# Patient Record
Sex: Female | Born: 1965 | Race: White | Hispanic: No | Marital: Married | Smoking: Never smoker
Health system: Southern US, Community
[De-identification: ages and names within clinical notes are randomized; demographics above are authoritative.]

---

## 2009-02-12 ENCOUNTER — Emergency Department (HOSPITAL_COMMUNITY): Admission: EM | Admit: 2009-02-12 | Discharge: 2009-02-12 | Payer: Self-pay | Admitting: Emergency Medicine

## 2011-03-26 LAB — URINE MICROSCOPIC-ADD ON

## 2011-03-26 LAB — URINALYSIS, ROUTINE W REFLEX MICROSCOPIC
Bilirubin Urine: NEGATIVE
Glucose, UA: NEGATIVE mg/dL
Ketones, ur: 40 mg/dL — AB
Nitrite: NEGATIVE
Protein, ur: 30 mg/dL — AB
pH: 8 (ref 5.0–8.0)

## 2011-03-26 LAB — POCT PREGNANCY, URINE: Preg Test, Ur: NEGATIVE

## 2011-03-26 LAB — POCT I-STAT, CHEM 8
Hemoglobin: 14.6 g/dL (ref 13.0–17.0)
Sodium: 140 mEq/L (ref 135–145)
TCO2: 23 mmol/L (ref 0–100)

## 2014-12-27 ENCOUNTER — Encounter (HOSPITAL_COMMUNITY): Payer: Self-pay | Admitting: Emergency Medicine

## 2014-12-27 ENCOUNTER — Emergency Department (HOSPITAL_COMMUNITY)
Admission: EM | Admit: 2014-12-27 | Discharge: 2014-12-27 | Disposition: A | Discharge status: Home / Self Care | Payer: BLUE CROSS/BLUE SHIELD | Attending: Emergency Medicine | Admitting: Emergency Medicine

## 2014-12-27 DIAGNOSIS — M549 Dorsalgia, unspecified: Secondary | ICD-10-CM | POA: Diagnosis present

## 2014-12-27 LAB — URINALYSIS, ROUTINE W REFLEX MICROSCOPIC
Bilirubin Urine: NEGATIVE
GLUCOSE, UA: NEGATIVE mg/dL
HGB URINE DIPSTICK: NEGATIVE
Ketones, ur: 15 mg/dL — AB
LEUKOCYTES UA: NEGATIVE
NITRITE: NEGATIVE
PH: 5.5 (ref 5.0–8.0)
PROTEIN: NEGATIVE mg/dL
SPECIFIC GRAVITY, URINE: 1.022 (ref 1.005–1.030)
UROBILINOGEN UA: 1 mg/dL (ref 0.0–1.0)

## 2014-12-27 MED ORDER — OXYCODONE-ACETAMINOPHEN 5-325 MG PO TABS
2.0000 | ORAL_TABLET | Freq: Once | ORAL | Status: AC
  Administered 2014-12-27: 2 via ORAL
  Filled 2014-12-27: qty 2

## 2014-12-27 MED ORDER — ONDANSETRON 4 MG PO TBDP
4.0000 mg | ORAL_TABLET | Freq: Once | ORAL | Status: AC
  Administered 2014-12-27: 4 mg via ORAL
  Filled 2014-12-27: qty 1

## 2014-12-27 MED ORDER — ONDANSETRON 4 MG PO TBDP: 4.0000 mg | ORAL_TABLET | Freq: Three times a day (TID) | ORAL | Status: DC | PRN

## 2014-12-27 MED ORDER — METHOCARBAMOL 500 MG PO TABS: 500.0000 mg | ORAL_TABLET | Freq: Two times a day (BID) | ORAL | Status: DC

## 2014-12-27 MED ORDER — OXYCODONE-ACETAMINOPHEN 5-325 MG PO TABS: 1.0000 | ORAL_TABLET | ORAL | Status: DC | PRN

## 2014-12-27 NOTE — ED Provider Notes (Signed)
CSN: [id number]     Arrival date & time 12/27/14  1133 History   First MD Initiated Contact with Patient 12/27/14 1149     Chief Complaint  Patient presents with  . Back Pain    R sided, after moving the christmas tree     (Consider location/radiation/quality/duration/timing/severity/associated sxs/prior Treatment) The history is provided by the patient and medical records.    49 y.o. F with no significant PMH presenting to the ED for low back pain after moving her christmas tree up 2 flights of stairs 1 week ago. Patient states localized to low back only, worse along right side, without radiation into extremities.   Patient states symptoms seem to be resolving up until 2 days ago when pain drastically worsened.   No numbness, paresthesias or weakness of extremities.  No loss of bowel or bladder control.   She does have history of kidney stones once before in the past.  Patient denies any abdominal pain, nausea, vomiting, urinary symptoms.  No fever, chills, sweats.  No baseline back issues.  History reviewed. No pertinent past medical history. History reviewed. No pertinent past surgical history. No family history on file. History  Substance Use Topics  . Smoking status: Never Smoker   . Smokeless tobacco: Not on file  . Alcohol Use: Not on file     Comment: rarely    Review of Systems  Musculoskeletal: Positive for back pain.  All other systems reviewed and are negative.     Allergies  Review of patient's allergies indicates no known allergies.  Home Medications   Prior to Admission medications   Not on File   BP 134/94 mmHg  Pulse 73  Temp(Src) 98.7 F (37.1 C) (Oral)  Resp 16  SpO2 100%   Physical Exam  Constitutional: He is oriented to person, place, and time. He appears well-developed and well-nourished. No distress.  HENT:  Head: Normocephalic and atraumatic.  Mouth/Throat: Oropharynx is clear and moist.  Eyes: Conjunctivae and EOM are normal. Pupils are  equal, round, and reactive to light.  Neck: Normal range of motion. Neck supple.  Cardiovascular: Normal rate, regular rhythm and normal heart sounds.   Pulmonary/Chest: Effort normal and breath sounds normal. No respiratory distress. He has no wheezes.  Abdominal: Normal appearance and bowel sounds are normal. There is no tenderness. There is no CVA tenderness.  No CVA tenderness  Musculoskeletal: Normal range of motion.       Lumbar back: He exhibits tenderness and pain.       Back:  Lumbar spine with focal tenderness over right paraspinal region; no midline deformity or step-off; full ROM maintained; normal strength and sensation of BLE; normal gait  Neurological: He is alert and oriented to person, place, and time.  Skin: Skin is warm and dry. He is not diaphoretic.  Psychiatric: He has a normal mood and affect.  Nursing note and vitals reviewed.   ED Course  Procedures (including critical care time) Labs Review Labs Reviewed  URINALYSIS, ROUTINE W REFLEX MICROSCOPIC - Abnormal; Notable for the following:    Ketones, ur 15 (*)    All other components within normal limits    Imaging Review No results found.   EKG Interpretation None      MDM   Final diagnoses:  Back pain, unspecified location   49 year old F with back pain after carrying christmas tree up 2 flights of stairs 1 week ago.  On exam, focal tenderness of right paraspinal region without midline deformity.  Back pain without any red flag symptoms or focal neurologic deficit. UA was obtained, no hematuria or signs of infection. Suspect symptoms are due to muscular strain, lower suspicion for kidney stone, pyelonephritis, etc.  Will treat with percocet, robaxin, zofran.  Encouraged to modify activity for the next few days, monitor improvement.  Discussed plan with patient, he/she acknowledged understanding and agreed with plan of care.  Return precautions given for new or worsening symptoms.  Garlon Hatchet,  PA-C 12/27/14 1314  Rolland Porter, MD 01/05/15 [phone]

## 2014-12-27 NOTE — Discharge Instructions (Signed)
Take the prescribed medication as directed.  Take it easy for the next few days, modify activities to present further muscular strain. Return to the ED for new or worsening symptoms.

## 2014-12-27 NOTE — ED Notes (Signed)
Pt aware of need for urine specimen, sts unable to provide at this time. Will re-attempt.

## 2014-12-27 NOTE — ED Notes (Signed)
Pt A+Ox4, reports R low back pain x1 week, onset after moving the christmas tree up two flights of stairs.  Pt reports worsening x2 days.  Pt reports "i thought it might be a kidney stone".  Pt denies n/v/d/c, denies fevers/chills, denies dysuria or complaints other than pain.  Ambulatory with steady gait, MAEI.  Skin PWD.  NAD.

## 2017-09-25 ENCOUNTER — Encounter: Payer: Self-pay | Admitting: Emergency Medicine

## 2017-09-25 ENCOUNTER — Emergency Department (INDEPENDENT_AMBULATORY_CARE_PROVIDER_SITE_OTHER)
Admission: EM | Admit: 2017-09-25 | Discharge: 2017-09-25 | Disposition: A | Discharge status: Home / Self Care | Payer: BLUE CROSS/BLUE SHIELD | Source: Home / Self Care

## 2017-09-25 ENCOUNTER — Emergency Department (INDEPENDENT_AMBULATORY_CARE_PROVIDER_SITE_OTHER): Payer: BLUE CROSS/BLUE SHIELD

## 2017-09-25 DIAGNOSIS — M79673 Pain in unspecified foot: Secondary | ICD-10-CM

## 2017-09-25 DIAGNOSIS — S93602A Unspecified sprain of left foot, initial encounter: Secondary | ICD-10-CM

## 2017-09-25 DIAGNOSIS — X501XXA Overexertion from prolonged static or awkward postures, initial encounter: Secondary | ICD-10-CM

## 2017-09-25 DIAGNOSIS — S99812A Other specified injuries of left ankle, initial encounter: Secondary | ICD-10-CM | POA: Diagnosis not present

## 2017-09-25 DIAGNOSIS — S93402A Sprain of unspecified ligament of left ankle, initial encounter: Secondary | ICD-10-CM

## 2017-09-25 NOTE — ED Provider Notes (Signed)
[facility]    CSN: [id number] Arrival date & time: 09/25/17  0947     History   Chief Complaint Chief Complaint  Patient presents with  . Foot Pain    HPI Daisy Jones is a 51 y.o. female.   The history is provided by the patient. No language interpreter was used.  Foot Pain  This is a new problem. The current episode started yesterday. The problem occurs constantly. The problem has been gradually worsening. The symptoms are aggravated by walking. Nothing relieves the symptoms. She has tried nothing for the symptoms.   Pt fell down a step last pm and twisted her ankle and foot.  Pt complains of pain with walking.   History reviewed. No pertinent past medical history.  There are no active problems to display for this patient.   History reviewed. No pertinent surgical history.  OB History    No data available       Home Medications    Prior to Admission medications   Not on File    Family History No family history on file.  Social History Social History  Substance Use Topics  . Smoking status: Never Smoker  . Smokeless tobacco: Never Used  . Alcohol use Not on file     Comment: rarely     Allergies   Patient has no known allergies.   Review of Systems Review of Systems  All other systems reviewed and are negative.    Physical Exam Triage Vital Signs ED Triage Vitals  Enc Vitals Group     BP 09/25/17 1016 130/84     Pulse Rate 09/25/17 1016 82     Resp --      Temp 09/25/17 1016 98.8 F (37.1 C)     Temp Source 09/25/17 1016 Oral     SpO2 09/25/17 1016 99 %     Weight 09/25/17 1016 170 lb (77.1 kg)     Height 09/25/17 1016  (1.727 m)     Head Circumference --      Peak Flow --      Pain Score 09/25/17 1017 4     Pain Loc --      Pain Edu? --      Excl. in GC? --    No data found.   Updated Vital Signs BP 130/84 (BP Location: Left Arm)   Pulse 82   Temp 98.8 F (37.1 C) (Oral)   Ht  (1.727 m)   Wt 170  lb (77.1 kg)   LMP 09/03/2017   SpO2 99%   BMI 25.85 kg/m   Visual Acuity Right Eye Distance:   Left Eye Distance:   Bilateral Distance:    Right Eye Near:   Left Eye Near:    Bilateral Near:     Physical Exam  Constitutional: She appears well-developed and well-nourished. No distress.  HENT:  Head: Normocephalic and atraumatic.  Eyes: Conjunctivae are normal.  Neck: Neck supple.  Cardiovascular: Normal rate and regular rhythm.   No murmur heard. Pulmonary/Chest: Effort normal and breath sounds normal. No respiratory distress.  Abdominal: There is no tenderness.  Musculoskeletal: She exhibits tenderness. She exhibits no edema.  Tender left ankle, pain with range of motion,  Tender left foot,  nv and ns intact  Neurological: She is alert.  Skin: Skin is warm and dry.  Psychiatric: She has a normal mood and affect.  Nursing note and vitals reviewed.    UC Treatments / Results  Labs (all labs ordered are listed, but only abnormal results are displayed) Labs Reviewed - No data to display  EKG  EKG Interpretation None       Radiology Dg Ankle Complete Left  Result Date: 09/25/2017 CLINICAL DATA:  Twisting injury left ankle going down stairs last night. Initial encounter. EXAM: LEFT ANKLE COMPLETE - 3+ VIEW COMPARISON:  None. FINDINGS: There is no evidence of fracture, dislocation, or joint effusion. There is no evidence of arthropathy or other focal bone abnormality. Soft tissues are unremarkable. IMPRESSION: Negative exam. Electronically Signed   By: Drusilla Kanner M.D.   On: 09/25/2017 10:34   Dg Foot Complete Left  Result Date: 09/25/2017 CLINICAL DATA:  Twisting injury left foot going down steps last night. Initial encounter. EXAM: LEFT FOOT - COMPLETE 3+ VIEW COMPARISON:  None. FINDINGS: There is no evidence of fracture or dislocation. There is no evidence of arthropathy or other focal bone abnormality. Soft tissues are unremarkable. IMPRESSION: Negative exam.  Electronically Signed   By: Drusilla Kanner M.D.   On: 09/25/2017 10:34    Procedures Procedures (including critical care time)  Medications Ordered in UC Medications - No data to display   Initial Impression / Assessment and Plan / UC Course  I have reviewed the triage vital signs and the nursing notes.  Pertinent labs & imaging results that were available during my care of the patient were reviewed by me and considered in my medical decision making (see chart for details).     Pt placed in an ace wrap. I advised ibuprofen and follow up with Dr. Karie Schwalbe for recheck next week.  Final Clinical Impressions(s) / UC Diagnoses   Final diagnoses:  Foot pain  Sprain of left foot, initial encounter  Sprain of left ankle, unspecified ligament, initial encounter    New Prescriptions New Prescriptions   No medications on file     Controlled Substance Prescriptions [place] Controlled Substance Registry consulted? Not Applicable  An After Visit Summary was printed and given to the patient.    Elson Areas, [place] 09/25/17 1042

## 2017-09-25 NOTE — Discharge Instructions (Signed)
Return if any problems.  Sere Dr. Karie Schwalbe if symptoms persist past one week.

## 2017-09-25 NOTE — ED Triage Notes (Signed)
Patient fell last night down a step, now having left foot and ankle pain.

## 2017-10-19 ENCOUNTER — Encounter: Payer: Self-pay | Admitting: Sports Medicine

## 2017-10-19 ENCOUNTER — Ambulatory Visit (INDEPENDENT_AMBULATORY_CARE_PROVIDER_SITE_OTHER): Payer: BLUE CROSS/BLUE SHIELD | Admitting: Sports Medicine

## 2017-10-19 DIAGNOSIS — S93492D Sprain of other ligament of left ankle, subsequent encounter: Secondary | ICD-10-CM | POA: Diagnosis not present

## 2017-10-19 DIAGNOSIS — M25572 Pain in left ankle and joints of left foot: Secondary | ICD-10-CM | POA: Insufficient documentation

## 2017-10-19 DIAGNOSIS — M21622 Bunionette of left foot: Secondary | ICD-10-CM

## 2017-10-19 MED ORDER — IBUPROFEN 800 MG PO TABS: 800.0000 mg | ORAL_TABLET | Freq: Three times a day (TID) | ORAL | 2 refills | Status: AC | PRN

## 2017-10-19 NOTE — Assessment & Plan Note (Signed)
Persistent pain, I am going to transition her into a boot for 2 weeks but she will come back for custom orthotics. Rehab exercises given.

## 2017-10-19 NOTE — Assessment & Plan Note (Signed)
With metatarsalgia. Return for custom molded orthotics.

## 2017-10-19 NOTE — Progress Notes (Signed)
   Subjective:    I'm seeing this patient as a consultation for: Daisy RocherErin Phelps PA-C  CC: Left foot and ankle pain  HPI: 3 weeks ago this pleasant 51 year old female inverted her left ankle, she had immediate pain and swelling, she was seen in urgent care where x-rays showed no evidence of fracture, she is referred to me for further evaluation and definitive treatment, ankle pain is improved considerably, she still has severe pain that she localizes on the plantar aspect of her left fifth MTP, describes it is walking on a bone.  Past medical history, Surgical history, Family history not pertinant except as noted below, Social history, Allergies, and medications have been entered into the medical record, reviewed, and no changes needed.   Review of Systems: No headache, visual changes, nausea, vomiting, diarrhea, constipation, dizziness, abdominal pain, skin rash, fevers, chills, night sweats, weight loss, swollen lymph nodes, body aches, joint swelling, muscle aches, chest pain, shortness of breath, mood changes, visual or auditory hallucinations.   Objective:   General: Well Developed, well nourished, and in no acute distress.  Neuro:  Extra-ocular muscles intact, able to move all 4 extremities, sensation grossly intact.  Deep tendon reflexes tested were normal. Psych: Alert and oriented, mood congruent with affect. ENT:  Ears and nose appear unremarkable.  Hearing grossly normal. Neck: Unremarkable overall appearance, trachea midline.  No visible thyroid enlargement. Eyes: Conjunctivae and lids appear unremarkable.  Pupils equal and round. Skin: Warm and dry, no rashes noted.  Cardiovascular: Pulses palpable, no extremity edema. Left ankle: Minimal swelling with mild tenderness over the anterior talofibular ligament Range of motion is full in all directions. Strength is 5/5 in all directions. Stable lateral and medial ligaments; squeeze test and kleiger test unremarkable; Talar dome  nontender; No pain at base of 5th MT; No tenderness over cuboid; No tenderness over N spot or navicular prominence No tenderness on posterior aspects of lateral and medial malleolus No sign of peroneal tendon subluxations; Negative tarsal tunnel tinel's Able to walk 4 steps. Left foot: No visible erythema or swelling. Range of motion is full in all directions. Strength is 5/5 in all directions. No hallux valgus. No pes cavus or pes planus. No abnormal callus noted. No pain over the navicular prominence, or base of fifth metatarsal. No tenderness to palpation of the calcaneal insertion of plantar fascia. No pain at the Achilles insertion. No pain over the calcaneal bursa. No pain of the retrocalcaneal bursa. No tenderness to palpation over the tarsals, metatarsals, or phalanges. No hallux rigidus or limitus. No tenderness palpation over interphalangeal joints. No pain with compression of the metatarsal heads. Neurovascularly intact distally. There is a moderate visible bunionette, there is a corn on the plantar aspect of the fourth MTP but with no tenderness in the area, she does have tenderness on the plantar aspect of the fifth MTP at the bunionette.  Impression and Recommendations:   This case required medical decision making of moderate complexity.  Bunionette of left foot With metatarsalgia. Return for custom molded orthotics.  Left ankle sprain Persistent pain, I am going to transition her into a boot for 2 weeks but she will come back for custom orthotics. Rehab exercises given.  ___________________________________________ Ihor Austinhomas J. Benjamin Stainhekkekandam, M.D., ABFM., CAQSM. Primary Care and Sports Medicine [facility]  Adjunct Instructor of Family Medicine  [employer]

## 2017-10-26 ENCOUNTER — Ambulatory Visit (INDEPENDENT_AMBULATORY_CARE_PROVIDER_SITE_OTHER): Payer: BLUE CROSS/BLUE SHIELD | Admitting: Sports Medicine

## 2017-10-26 ENCOUNTER — Encounter: Payer: Self-pay | Admitting: Sports Medicine

## 2017-10-26 DIAGNOSIS — M21622 Bunionette of left foot: Secondary | ICD-10-CM

## 2017-10-26 NOTE — Assessment & Plan Note (Signed)
New set of custom orthotics, return as previously scheduled follow-up.

## 2017-10-26 NOTE — Progress Notes (Signed)
    Patient was fitted for a : standard, cushioned, semi-rigid orthotic. The orthotic was heated and afterward the patient stood on the orthotic blank positioned on the orthotic stand. The patient was positioned in subtalar neutral position and 10 degrees of ankle dorsiflexion in a weight bearing stance. After completion of molding, a stable base was applied to the orthotic blank. The blank was ground to a stable position for weight bearing. Size: 8 Base: White EVA Additional Posting and Padding: None The patient ambulated these, and they were very comfortable.  I spent 40 minutes with this patient, greater than 50% was face-to-face time counseling regarding the below diagnosis.  ___________________________________________ Thomas J. Thekkekandam, M.D., ABFM., CAQSM. Primary Care and Sports Medicine  [facility]  Adjunct Instructor of Family Medicine  [employer]   

## 2017-11-09 ENCOUNTER — Encounter: Payer: Self-pay | Admitting: Sports Medicine

## 2017-11-09 ENCOUNTER — Ambulatory Visit (INDEPENDENT_AMBULATORY_CARE_PROVIDER_SITE_OTHER): Payer: BLUE CROSS/BLUE SHIELD | Admitting: Sports Medicine

## 2017-11-09 DIAGNOSIS — M21622 Bunionette of left foot: Secondary | ICD-10-CM

## 2017-11-09 DIAGNOSIS — G8929 Other chronic pain: Secondary | ICD-10-CM

## 2017-11-09 DIAGNOSIS — M25572 Pain in left ankle and joints of left foot: Secondary | ICD-10-CM

## 2017-11-09 NOTE — Assessment & Plan Note (Signed)
Severe pain, persistent in spite of conservative measures. Left fifth MTP injection as above, return in 1 month.

## 2017-11-09 NOTE — Assessment & Plan Note (Signed)
Persistent symptoms in the posterior ankle joint in spite of physical therapy, orthotics, NSAIDs, immobilization. Symptoms have been greater than 6 weeks now, proceeding with left ankle MRI.

## 2017-11-09 NOTE — Progress Notes (Signed)
Subjective:    CC: Left foot pain  HPI: Daisy FiscalLori returns, she has multifactorial left foot and ankle pain, foot pain is centered around the left fifth metatarsophalangeal joint, she does have a bunionette in this location.  We tried custom orthotics, NSAIDs, Cam boot, she really has not had any improvement so far after a month.  She is also having pain at the posterior ankle joint, worse with terminal plantar flexion.  Moderate, persistent without radiation  Past medical history:  Negative.  See flowsheet/record as well for more information.  Surgical history: Negative.  See flowsheet/record as well for more information.  Family history: Negative.  See flowsheet/record as well for more information.  Social history: Negative.  See flowsheet/record as well for more information.  Allergies, and medications have been entered into the medical record, reviewed, and no changes needed.   Review of Systems: No fevers, chills, night sweats, weight loss, chest pain, or shortness of breath.   Objective:    General: Well Developed, well nourished, and in no acute distress.  Neuro: Alert and oriented x3, extra-ocular muscles intact, sensation grossly intact.  HEENT: Normocephalic, atraumatic, pupils equal round reactive to light, neck supple, no masses, no lymphadenopathy, thyroid nonpalpable.  Skin: Warm and dry, no rashes. Cardiac: Regular rate and rhythm, no murmurs rubs or gallops, no lower extremity edema.  Respiratory: Clear to auscultation bilaterally. Not using accessory muscles, speaking in full sentences. Left ankle: No visible erythema or swelling. Range of motion is full in all directions. She still has reproduction of pain with terminal plantar flexion Strength is 5/5 in all directions. Stable lateral and medial ligaments; squeeze test and kleiger test unremarkable; Talar dome nontender; No pain at base of 5th MT; No tenderness over cuboid; No tenderness over N spot or navicular  prominence No tenderness on posterior aspects of lateral and medial malleolus No sign of peroneal tendon subluxations; Negative tarsal tunnel tinel's Able to walk 4 steps. Left foot: No visible erythema or swelling. Range of motion is full in all directions. Strength is 5/5 in all directions. No hallux valgus. No pes cavus or pes planus. No abnormal callus noted. No pain over the navicular prominence, or base of fifth metatarsal. No tenderness to palpation of the calcaneal insertion of plantar fascia. No pain at the Achilles insertion. No pain over the calcaneal bursa. No pain of the retrocalcaneal bursa. Tenderness to palpation over her bunionette at the fifth MTP No hallux rigidus or limitus. No tenderness palpation over interphalangeal joints. No pain with compression of the metatarsal heads. Neurovascularly intact distally.  Procedure: Real-time Ultrasound Guided Injection of left fifth metatarsophalangeal joint Device: GE Logiq E  Verbal informed consent obtained.  Time-out conducted.  Noted no overlying erythema, induration, or other signs of local infection.  Skin prepped in a sterile fashion.  Local anesthesia: Topical Ethyl chloride.  With sterile technique and under real time ultrasound guidance: 1/2 cc Kenalog 40, 1/2 cc lidocaine injected easily Completed without difficulty  Pain immediately resolved suggesting accurate placement of the medication.  Advised to call if fevers/chills, erythema, induration, drainage, or persistent bleeding.  Images permanently stored and available for review in the ultrasound unit.  Impression: Technically successful ultrasound guided injection.  Impression and Recommendations:    Bunionette of left foot Severe pain, persistent in spite of conservative measures. Left fifth MTP injection as above, return in 1 month.  Left ankle pain Persistent symptoms in the posterior ankle joint in spite of physical therapy, orthotics, NSAIDs,  immobilization.  Symptoms have been greater than 6 weeks now, proceeding with left ankle MRI. ___________________________________________ Ihor Austinhomas J. Benjamin Stainhekkekandam, M.D., ABFM., CAQSM. Primary Care and Sports Medicine [facility]  Adjunct Instructor of Family Medicine  [employer]

## 2017-11-22 ENCOUNTER — Other Ambulatory Visit: Payer: BLUE CROSS/BLUE SHIELD

## 2017-12-06 ENCOUNTER — Ambulatory Visit (INDEPENDENT_AMBULATORY_CARE_PROVIDER_SITE_OTHER): Payer: BLUE CROSS/BLUE SHIELD

## 2017-12-06 DIAGNOSIS — M25572 Pain in left ankle and joints of left foot: Secondary | ICD-10-CM

## 2017-12-06 DIAGNOSIS — G8929 Other chronic pain: Secondary | ICD-10-CM | POA: Diagnosis not present

## 2017-12-20 ENCOUNTER — Encounter: Payer: Self-pay | Admitting: Sports Medicine

## 2017-12-20 ENCOUNTER — Ambulatory Visit (INDEPENDENT_AMBULATORY_CARE_PROVIDER_SITE_OTHER): Payer: BLUE CROSS/BLUE SHIELD | Admitting: Sports Medicine

## 2017-12-20 DIAGNOSIS — M25572 Pain in left ankle and joints of left foot: Secondary | ICD-10-CM | POA: Diagnosis not present

## 2017-12-20 DIAGNOSIS — G8929 Other chronic pain: Secondary | ICD-10-CM

## 2017-12-20 NOTE — Assessment & Plan Note (Signed)
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 Casted as above, walking cast. Return in 1 month.

## 2017-12-20 NOTE — Progress Notes (Signed)
  Subjective:    CC: Follow-up MRI  HPI: Returns, her foot has done significantly better after her left first MTP injection, pain-free now.  She continues to have posterior ankle joint pain.  MRI was obtained the results of which will be dictated below.  Ankle pain is moderate, persistent, localized without radiation.  I reviewed the past medical history, family history, social history, surgical history, and allergies today and no changes were needed.  Please see the problem list section below in epic for further details.  Past Medical History: No past medical history on file. Past Surgical History: No past surgical history on file. Social History: Social History   Socioeconomic History  . Marital status: Married    Spouse name: None  . Number of children: None  . Years of education: None  . Highest education level: None  Social Needs  . Financial resource strain: None  . Food insecurity - worry: None  . Food insecurity - inability: None  . Transportation needs - medical: None  . Transportation needs - non-medical: None  Occupational History  . None  Tobacco Use  . Smoking status: Never Smoker  . Smokeless tobacco: Never Used  Substance and Sexual Activity  . Alcohol use: None    Comment: rarely  . Drug use: None  . Sexual activity: None  Other Topics Concern  . None  Social History Narrative  . None   Family History: No family history on file. Allergies: No Known Allergies Medications: See med rec.  Review of Systems: No fevers, chills, night sweats, weight loss, chest pain, or shortness of breath.   Objective:    General: Well Developed, well nourished, and in no acute distress.  Neuro: Alert and oriented x3, extra-ocular muscles intact, sensation grossly intact.  HEENT: Normocephalic, atraumatic, pupils equal round reactive to light, neck supple, no masses, no lymphadenopathy, thyroid nonpalpable.  Skin: Warm and dry, no rashes. Cardiac: Regular rate and  rhythm, no murmurs rubs or gallops, no lower extremity edema.  Respiratory: Clear to auscultation bilaterally. Not using accessory muscles, speaking in full sentences. Left ankle: Minimal swelling laterally Range of motion is full in all directions. Strength is 5/5 in all directions. Stable lateral and medial ligaments; squeeze test and kleiger test unremarkable; Talar dome nontender; No pain at base of 5th MT; No tenderness over cuboid; No tenderness over N spot or navicular prominence No tenderness on posterior aspects of lateral and medial malleolus No sign of peroneal tendon subluxations; Negative tarsal tunnel tinel's Able to walk 4 steps.  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 Casted as above, walking cast. Return in 1 month. ___________________________________________ Ihor Austinhomas J. Benjamin Stainhekkekandam, M.D., ABFM., CAQSM. Primary Care and Sports Medicine [facility]  Adjunct Instructor of Family Medicine  [employer]

## 2018-01-13 ENCOUNTER — Telehealth: Payer: Self-pay | Admitting: Sports Medicine

## 2018-01-13 NOTE — Telephone Encounter (Signed)
Have her see me tomorrow for a new cast.

## 2018-01-13 NOTE — Telephone Encounter (Signed)
Patient called left voicemail to see if she could get in earlier for cast removal adv that her hell is moving all around and the cast is soft on the bottom. The cast is smashing her toes and the water proof part she wears on it has broken also. I adv no openings left for today or tomorrow she is already scheduled for Monday. Pt request a nurse to call back if possible

## 2018-01-17 ENCOUNTER — Ambulatory Visit (INDEPENDENT_AMBULATORY_CARE_PROVIDER_SITE_OTHER): Payer: BLUE CROSS/BLUE SHIELD | Admitting: Sports Medicine

## 2018-01-17 ENCOUNTER — Encounter: Payer: Self-pay | Admitting: Sports Medicine

## 2018-01-17 DIAGNOSIS — M25572 Pain in left ankle and joints of left foot: Secondary | ICD-10-CM

## 2018-01-17 DIAGNOSIS — G8929 Other chronic pain: Secondary | ICD-10-CM

## 2018-01-17 NOTE — Assessment & Plan Note (Signed)
Multiple pathologic processes including ATFL and CFL tears as well as a deltoid ligament sprain. Cast is removed and the above pain has resolved, she has some expected stiffness and soreness. Strap with compressive dressing, she will trace out the alphabet every night. Return to see me in 1 month.

## 2018-01-17 NOTE — Progress Notes (Signed)
  Subjective:    CC: Follow-up  HPI: This is a pleasant 52 year old female, she had chronic ankle pain with ATFL and CFL tears as well as a deltoid ligament injury.  We placed her in a cast after failure of conservative measures and she returns today for cast removal.  I reviewed the past medical history, family history, social history, surgical history, and allergies today and no changes were needed.  Please see the problem list section below in epic for further details.  Past Medical History: No past medical history on file. Past Surgical History: No past surgical history on file. Social History: Social History   Socioeconomic History  . Marital status: Married    Spouse name: None  . Number of children: None  . Years of education: None  . Highest education level: None  Social Needs  . Financial resource strain: None  . Food insecurity - worry: None  . Food insecurity - inability: None  . Transportation needs - medical: None  . Transportation needs - non-medical: None  Occupational History  . None  Tobacco Use  . Smoking status: Never Smoker  . Smokeless tobacco: Never Used  Substance and Sexual Activity  . Alcohol use: None    Comment: rarely  . Drug use: None  . Sexual activity: None  Other Topics Concern  . None  Social History Narrative  . None   Family History: No family history on file. Allergies: No Known Allergies Medications: See med rec.  Review of Systems: No fevers, chills, night sweats, weight loss, chest pain, or shortness of breath.   Objective:    General: Well Developed, well nourished, and in no acute distress.  Neuro: Alert and oriented x3, extra-ocular muscles intact, sensation grossly intact.  HEENT: Normocephalic, atraumatic, pupils equal round reactive to light, neck supple, no masses, no lymphadenopathy, thyroid nonpalpable.  Skin: Warm and dry, no rashes. Cardiac: Regular rate and rhythm, no murmurs rubs or gallops, no lower  extremity edema.  Respiratory: Clear to auscultation bilaterally. Not using accessory muscles, speaking in full sentences. Left ankle: Cast is removed, no visible erythema or swelling. Expected lack of range of motion due to prolonged immobilization. Strength is 5/5 in all directions. Stable lateral and medial ligaments; squeeze test and kleiger test unremarkable; Talar dome nontender; No pain at base of 5th MT; No tenderness over cuboid; No tenderness over N spot or navicular prominence No tenderness on posterior aspects of lateral and medial malleolus No sign of peroneal tendon subluxations; Negative tarsal tunnel tinel's Able to walk 4 steps.  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 Cast is removed and the above pain has resolved, she has some expected stiffness and soreness. Strap with compressive dressing, she will trace out the alphabet every night. Return to see me in 1 month.  I spent 25 minutes with this patient, greater than 50% was face-to-face time counseling regarding the above diagnoses ___________________________________________ Ihor Austinhomas J. Benjamin Stainhekkekandam, M.D., ABFM., CAQSM. Primary Care and Sports Medicine [facility]  Adjunct Instructor of Family Medicine  [employer]

## 2018-02-14 ENCOUNTER — Ambulatory Visit (INDEPENDENT_AMBULATORY_CARE_PROVIDER_SITE_OTHER): Payer: BLUE CROSS/BLUE SHIELD | Admitting: Sports Medicine

## 2018-02-14 ENCOUNTER — Encounter: Payer: Self-pay | Admitting: Sports Medicine

## 2018-02-14 DIAGNOSIS — M25572 Pain in left ankle and joints of left foot: Secondary | ICD-10-CM

## 2018-02-14 DIAGNOSIS — G8929 Other chronic pain: Secondary | ICD-10-CM

## 2018-02-14 NOTE — Progress Notes (Signed)
  Subjective:    CC:   HPI:   I reviewed the past medical history, family history, social history, surgical history, and allergies today and no changes were needed.  Please see the problem list section below in epic for further details.  Past Medical History: No past medical history on file. Past Surgical History: No past surgical history on file. Social History: Social History   Socioeconomic History  . Marital status: Married    Spouse name: None  . Number of children: None  . Years of education: None  . Highest education level: None  Social Needs  . Financial resource strain: None  . Food insecurity - worry: None  . Food insecurity - inability: None  . Transportation needs - medical: None  . Transportation needs - non-medical: None  Occupational History  . None  Tobacco Use  . Smoking status: Never Smoker  . Smokeless tobacco: Never Used  Substance and Sexual Activity  . Alcohol use: None    Comment: rarely  . Drug use: None  . Sexual activity: None  Other Topics Concern  . None  Social History Narrative  . None   Family History: No family history on file. Allergies: No Known Allergies Medications: See med rec.  Review of Systems: No fevers, chills, night sweats, weight loss, chest pain, or shortness of breath.   Objective:    General: Well Developed, well nourished, and in no acute distress.  Neuro: Alert and oriented x3, extra-ocular muscles intact, sensation grossly intact.  HEENT: Normocephalic, atraumatic, pupils equal round reactive to light, neck supple, no masses, no lymphadenopathy, thyroid nonpalpable.  Skin: Warm and dry, no rashes. Cardiac: Regular rate and rhythm, no murmurs rubs or gallops, no lower extremity edema.  Respiratory: Clear to auscultation bilaterally. Not using accessory muscles, speaking in full sentences.   Impression and Recommendations:    Left ankle pain MRI from several months ago did show several pathologic processes  including ATFL and CFL tears as well as a deltoid ligament sprain. When we removed the cast a month ago all of her pain had resolved, she did have some discomfort in the back, likely from prolonged immobilization. Continues to have only minimal discomfort in the back, worse when going up and down stairs, the previous pathologic changes of likely resolved. I am going to place her into formal physical therapy for 6 weeks before proceeding any further with follow-up advanced imaging.   ___________________________________________ Ihor Austinhomas J. Benjamin Stainhekkekandam, M.D., ABFM., CAQSM. Primary Care and Sports Medicine [facility]  Adjunct Instructor of Family Medicine  [employer]

## 2018-02-14 NOTE — Assessment & Plan Note (Signed)
MRI from several months ago did show several pathologic processes including ATFL and CFL tears as well as a deltoid ligament sprain. When we removed the cast a month ago all of her pain had resolved, she did have some discomfort in the back, likely from prolonged immobilization. Continues to have only minimal discomfort in the back, worse when going up and down stairs, the previous pathologic changes of likely resolved. I am going to place her into formal physical therapy for 6 weeks before proceeding any further with follow-up advanced imaging.

## 2018-02-28 ENCOUNTER — Ambulatory Visit (INDEPENDENT_AMBULATORY_CARE_PROVIDER_SITE_OTHER): Payer: BLUE CROSS/BLUE SHIELD | Admitting: Rehabilitative and Restorative Service Providers"

## 2018-02-28 ENCOUNTER — Encounter: Payer: Self-pay | Admitting: Rehabilitative and Restorative Service Providers"

## 2018-02-28 DIAGNOSIS — M6281 Muscle weakness (generalized): Secondary | ICD-10-CM | POA: Diagnosis not present

## 2018-02-28 DIAGNOSIS — R29898 Other symptoms and signs involving the musculoskeletal system: Secondary | ICD-10-CM | POA: Diagnosis not present

## 2018-02-28 DIAGNOSIS — M25572 Pain in left ankle and joints of left foot: Secondary | ICD-10-CM

## 2018-02-28 NOTE — Patient Instructions (Addendum)
Mobilization of the bones in your feet    PROM: Toe Flexion / Extension    Gently grasp right toes and curl then straighten them. Hold each position __20-30__ seconds. Repeat __3__ times per set.  Do __1-2__ sessions per day. Have someone else move foot.   Toe Curl: Bilateral    With both feet resting on towel, slowly bunch up towel by curling toes. Hold __30-60__ seconds. Repeat __3__ times per set.  Do __1__ sessions per day.   Inversion: Resisted    Cross legs with right leg underneath, foot in tubing loop. Hold tubing around other foot to resist and turn foot in. Repeat __10__ times per set. Do _2-3___ sets per session. Do __1__ sessions per day.   Eversion: Resisted    With right foot in tubing loop, hold tubing around other foot to resist and turn foot out. Repeat __10__ times per set. Do __2-3__ sets per session. Do __1__ sessions per day.   Balance: Unilateral    Attempt to balance on left leg, eyes open. Hold _30-60___ seconds. Repeat __3__ times per set. Do __2-3__ sessions per day. Perform exercise with eyes closed.    Gastroc Stretch    Stand with right foot back, leg straight, forward leg bent. Keeping heel on floor, turned slightly out, lean into wall until stretch is felt in calf. Hold __30__ seconds. Repeat __3__ times per set. Do __2__ sessions per day.   Soleus Stretch    Stand with right foot back, both knees bent. Keeping heel on floor, turned slightly out, lean into wall until stretch is felt in lower calf. Hold __30__ seconds. Repeat __3__ times per set.  Do __2__ sessions per day.

## 2018-02-28 NOTE — Therapy (Addendum)
[facility] [address] [place], [place], [address] Phone: [phone]   Fax:  [phone]  Physical Therapy Evaluation  Patient Details  Name: Daisy Jones MRN: [id number] Date of Birth: 05/10/1966 Referring Provider: Dt Thekkekandam    Encounter Date: 02/28/2018  PT End of Session - 02/28/18 1337    Visit Number  1    Number of Visits  12    Date for PT Re-Evaluation  04/11/18    PT Start Time  1148    PT Stop Time  1255    PT Time Calculation (min)  67 min    Activity Tolerance  Patient tolerated treatment well       History reviewed. No pertinent past medical history.  History reviewed. No pertinent surgical history.  There were no vitals filed for this visit.   Subjective Assessment - 02/28/18 1157    Subjective  Patient reports that she rolled her ankle and fell down several steps. X-rays showed severe sprain on Lt foot and ankle 10/18. She was placed in a walking boot ~ 3 weeks after the injury and remained in the boot for ~ 1 month. She was ten casted for ~1 month. with symptoms increasing with casting. Recevied steroid injection between boot and cast. She has been out of the cast for ~5 weeks.. She continues to have Lt ankle pain with certain movements. She has increased pain with ascending or descending stairs and walking on uneven surfaces. She has sharp with attenpts to run. She has some "cracking" in the foot.     Pertinent History  crepitus bilat knees - no pain     Diagnostic tests  Xray MRI     Patient Stated Goals  get ROM back and return to normal activities     Currently in Pain?  Yes    Pain Score  0-No pain    Pain Location  Ankle    Pain Orientation  Left    Pain Radiating Towards  in ankle     Pain Onset  More than a month ago    Pain Frequency  Intermittent    Aggravating Factors   ascending and descending stairs; attempt to run    Pain Relieving Factors  avoiding activities that cause pain           [facility] PT Assessment - 02/28/18 0001      Assessment   Medical Diagnosis  Lt ankle pain     Referring Provider  Dt Thekkekandam     Onset Date/Surgical Date  09/17/17    Hand Dominance  Right    Next MD Visit  4/19    Prior Therapy  for shoulder       Precautions   Precautions  None      Restrictions   Weight Bearing Restrictions  No      Balance Screen   Has the patient fallen in the past 6 months  Yes    How many times?  1    Has the patient had a decrease in activity level because of a fear of falling?   No    Is the patient reluctant to leave their home because of a fear of falling?   No      Prior Function   Level of Independence  Independent    Vocation  Other (comment)    Vocation Requirements  household chores; child care     Leisure  walking 4 days/wk for ~ 5 miles         Observation/Other Assessments   Focus on Therapeutic Outcomes (FOTO)   41% limitation       Sensation   Additional Comments  WFL's per pt report       Posture/Postural Control   Posture Comments  stands with hyperextended       AROM   Right/Left Ankle  -- AROM - WFL's and ~ equal bilat       Strength   Right/Left Ankle  -- Rt ankle 5/5 throughout     Left Ankle Dorsiflexion  5/5    Left Ankle Plantar Flexion  4+/5 pain     Left Ankle Inversion  4+/5    Left Ankle Eversion  4+/5      Flexibility   Hamstrings  tight Rt     Quadriceps  WNL's bilat     ITB  tight Rt    Piriformis  tight Rt       Palpation   Palpation comment  tightness to palpation through the Lt toes/forefoot with mobilization; tender Lt ATFL; mild tenderness through gastroc/soleus on Lt       Balance   Balance Assessed  -- SLS 10 sec each LE less steady on Lt              Objective measurements completed on examination: See above findings.      [facility] Adult PT Treatment/Exercise - 02/28/18 0001      Knee/Hip Exercises: Stretches   Passive Hamstring Stretch  Left;2 reps;30 seconds supine with  strap     ITB Stretch  Left;2 reps;30 seconds supine with strap     Piriformis Stretch  Left;2 reps;30 seconds supine travell      Vasopneumatic   Number Minutes Vasopneumatic   15 minutes    Vasopnuematic Location   Ankle Lt     Vasopneumatic Pressure  Low    Vasopneumatic Temperature   34 deg      Ankle Exercises: Stretches   Soleus Stretch  2 reps;30 seconds    Soleus Stretch Limitations  stretch and pain with soleus stretch     Gastroc Stretch  2 reps;30 seconds    Gastroc Stretch Limitations  minimal stretch with gastroc stretch     Other Stretch  PROM ankle PF with toe flexion    Other Stretch  joint mobilization through the forefoot and toes Lt foot       Ankle Exercises: Seated   Towel Crunch  1 rep      Ankle Exercises: Supine   T-Band  inversion/eversion red TB x 10 reps each              PT Education - 02/28/18 1233    Education provided  Yes    Education Details  HEP     Person(s) Educated  Patient    Methods  Explanation;Demonstration;Tactile cues;Verbal cues;Handout    Comprehension  Verbalized understanding;Returned demonstration;Verbal cues required;Tactile cues required          PT Long Term Goals - 02/28/18 1345      PT LONG TERM GOAL #1   Title  Improve joint mobility through Lt ankle and foot to equal Rt 04/11/18    Time  6    Period  Weeks    Status  New      PT LONG TERM GOAL #2   Title  5/5 strength Lt ankle 04/11/18    Time  6    Period  Weeks    Status  New  PT LONG TERM GOAL #3   Title  Patient reports ability to ascend and descend steps step over step 04/11/18    Time  6    Period  Weeks    Status  New      PT LONG TERM GOAL #4   Title  Independent in HEP 04/11/18    Time  6    Period  Weeks    Status  New      PT LONG TERM GOAL #5   Title  Improve FOTO to </= 28% limitation 04/11/18    Time  6    Period  Weeks    Status  New             Plan - 02/28/18 1338    Clinical Impression Statement  Daisy Jones presents  s/p Lt ankle and foot sprain 10/18 which was immobilized for several weeks. She presents today with continued tightness and discomfort in Lt ankle and foot with higher level functional activities. Daisy Jones has some limitations in ankle and foot motions; ankle weakness; decreased tolerance for higher level gait/stair climbing/any running or push off with Lt foot or ankle. She will benefit from PT to address problems identified.     Clinical Presentation  Stable    Clinical Decision Making  Low    Rehab Potential  Good    PT Frequency  2x / week    PT Duration  6 weeks    PT Treatment/Interventions  Patient/family education;ADLs/Self Care Home Management;Cryotherapy;Electrical Stimulation;Iontophoresis 66m/ml Dexamethasone;Moist Heat;Ultrasound;Dry needling;Manual techniques;Neuromuscular re-education;Therapeutic activities;Therapeutic exercise;Balance training;Gait training    PT Next Visit Plan  review HEP; progress with mobility and strengthening for Lt ankle and foot; joint mobs Lt ankle and foot; manual work as indicated Lt LE; higher level balance activities and gait training; stair climbing; modalities as indicated     Consulted and Agree with Plan of Care  Patient       Patient will benefit from skilled therapeutic intervention in order to improve the following deficits and impairments:  Postural dysfunction, Improper body mechanics, Pain, Hypomobility, Decreased mobility, Decreased activity tolerance  Visit Diagnosis: Pain in left ankle and joints of left foot - Plan: PT plan of care cert/re-cert  Muscle weakness (generalized) - Plan: PT plan of care cert/re-cert  Other symptoms and signs involving the musculoskeletal system - Plan: PT plan of care cert/re-cert     Problem List Patient Active Problem List   Diagnosis Date Noted  . Left ankle pain 10/19/2017  . Bunionette of left foot 10/19/2017    Thinh Cuccaro PNilda SimmerPT, MPH  02/28/2018, 1:50 PM  [facility] [place] [address] [address] [place] [address]Phone: [phone]  Fax:  [phone] Name: HReesha DebesMRN: [id number] of Birth: [birth year] PHYSICAL THERAPY DISCHARGE SUMMARY  Visits from Start of Care: Evaluation only   Current functional level related to goals / functional outcomes: See eval   Remaining deficits: Unchanged    Education / Equipment: Initial HEP  Plan: Patient agrees to discharge.  Patient goals were not met. Patient is being discharged due to not returning since the last visit.  ?????     Patient was seen for eval only and has not returned for additional appointments.  Nanie Dunkleberger P. HHelene KelpPT, MPH 04/07/18 9:59 AM

## 2018-03-28 ENCOUNTER — Ambulatory Visit: Payer: BLUE CROSS/BLUE SHIELD | Admitting: Sports Medicine

## 2018-10-10 IMAGING — MR MR ANKLE*L* W/O CM
5 series · 40 of 40 positions shown · non-contrast
Comparison: Radiographs from 09/25/2017

CLINICAL DATA: Inversion ankle with fall in September 2017, continued
heel pain.

EXAM:
MRI OF THE LEFT ANKLE WITHOUT CONTRAST
TECHNIQUE: Multiplanar, multisequence MR imaging of the ankle was performed. No
intravenous contrast was administered.

[Series 3: PD fat-sat · axial · 3.0mm · 0.66mm/px · z∈[-105,+24]mm · 9 of 40 slices shown]
[im 1/40]
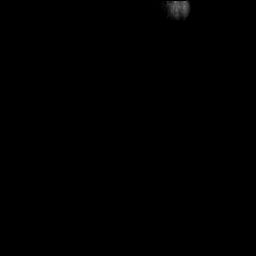
[im 5/40]
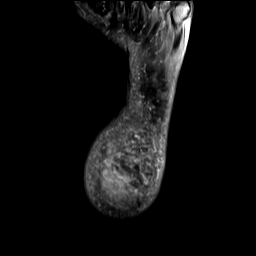
[im 10/40]
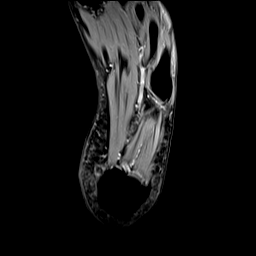
[im 15/40]
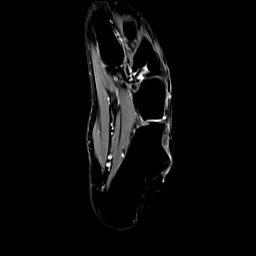
[im 20/40]
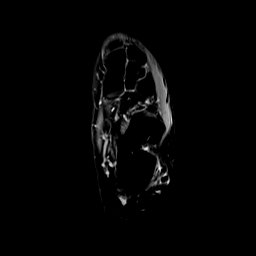
[im 25/40]
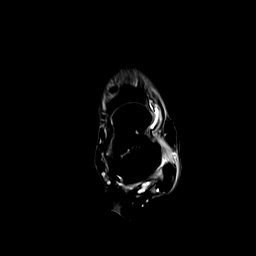
[im 30/40]
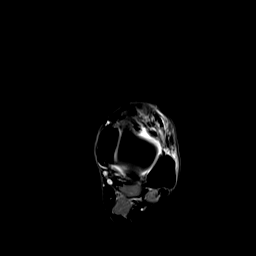
[im 35/40]
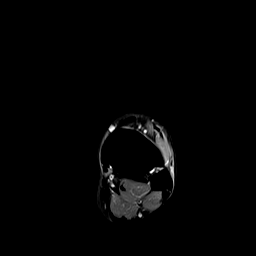
[im 40/40]
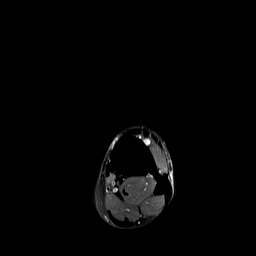

[Series 4: T2 fat-sat · axial · 3.0mm · 0.66mm/px · z∈[-105,+24]mm · 9 of 40 slices shown (1 of 3)]
[im 1/40]
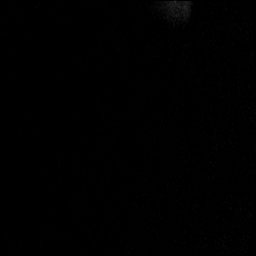
[im 5/40]
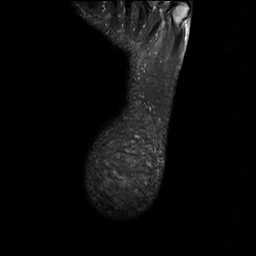
[im 10/40]
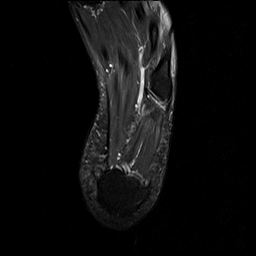
[im 15/40]
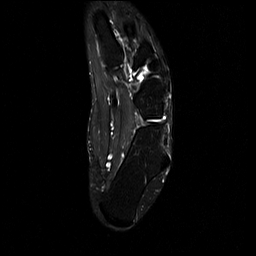
[im 20/40]
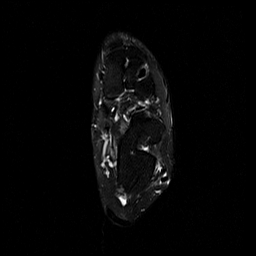
[im 25/40]
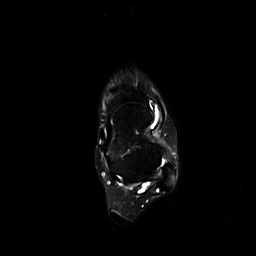
[im 30/40]
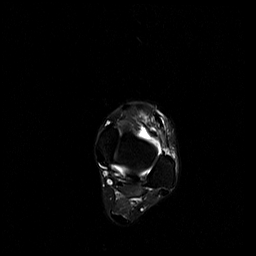
[im 35/40]
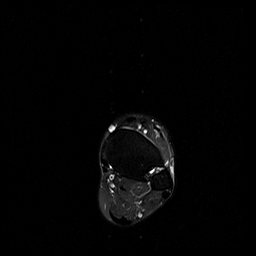
[im 40/40]
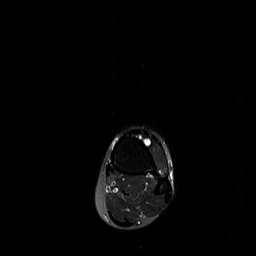

[Series 5: T2 fat-sat · coronal · 3.0mm · 0.70mm/px · 10 of 45 slices shown (2 of 3)]
[im 1/45]
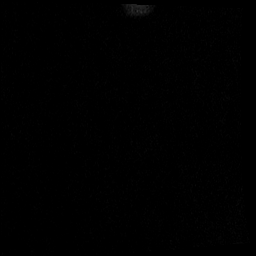
[im 5/45]
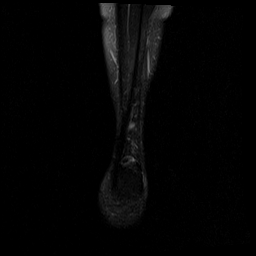
[im 10/45]
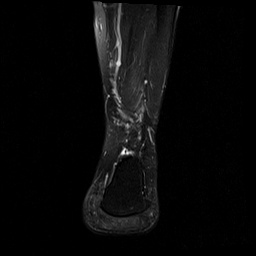
[im 15/45]
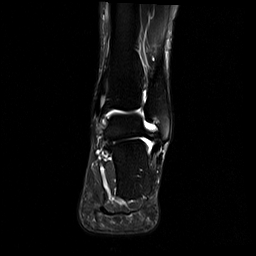
[im 20/45]
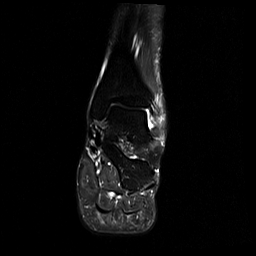
[im 25/45]
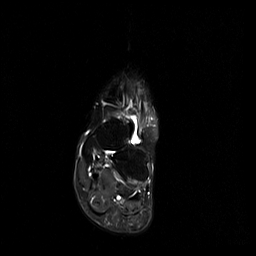
[im 30/45]
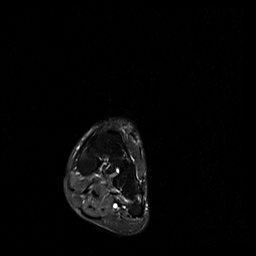
[im 35/45]
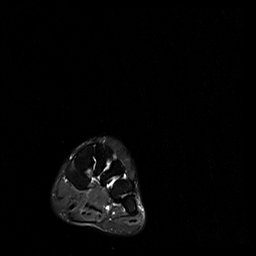
[im 40/45]
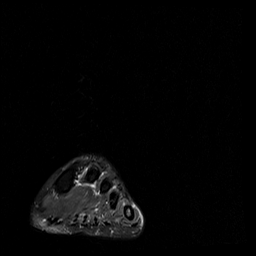
[im 45/45]
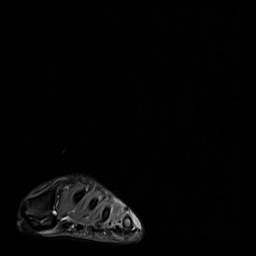

[Series 6: T1 · sagittal · 3.0mm · 0.56mm/px · 6 of 26 slices shown]
[im 1/26]
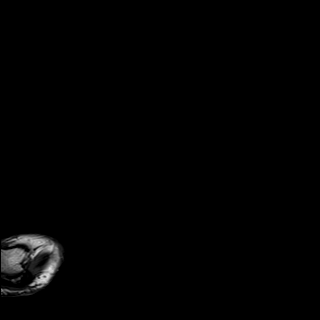
[im 6/26]
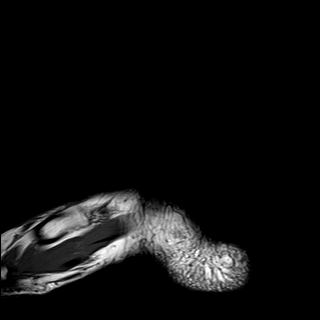
[im 11/26]
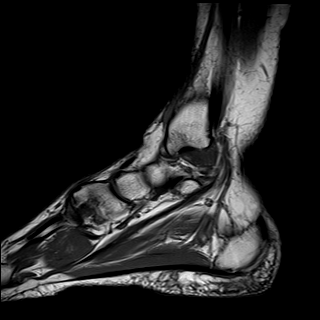
[im 16/26]
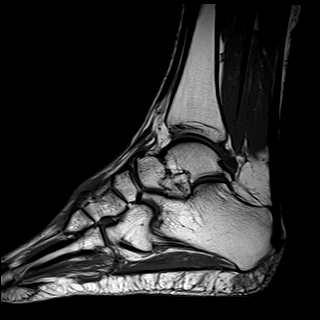
[im 21/26]
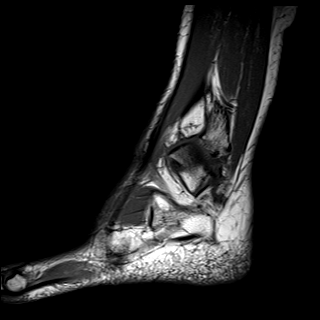
[im 26/26]
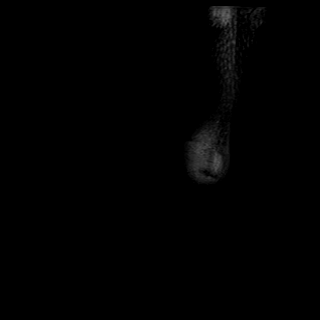

[Series 7: T2 fat-sat · sagittal · 3.0mm · 0.70mm/px · 6 of 26 slices shown (3 of 3)]
[im 1/26]
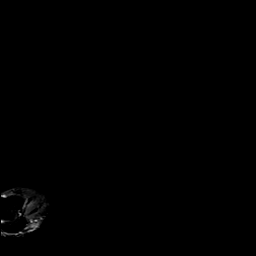
[im 6/26]
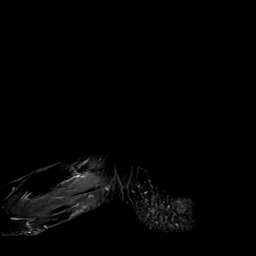
[im 11/26]
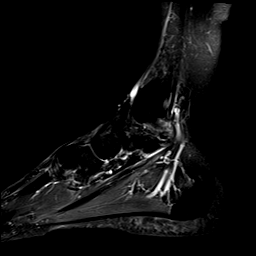
[im 16/26]
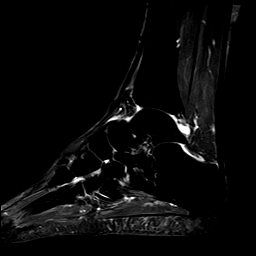
[im 21/26]
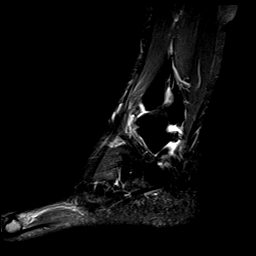
[im 26/26]
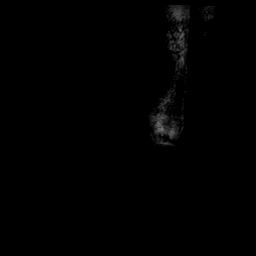

[40 of 40 positions shown; findings below may reference images not displayed]

FINDINGS: TENDONS

Peroneal: Common peroneus tendon sheath tenosynovitis

Posteromedial: Unremarkable

Anterior: Extensor digitorum longus tenosynovitis.

Achilles: Unremarkable

Plantar Fascia: Unremarkable

LIGAMENTS

Lateral: Prominent thickening and edema within along the anterior
talofibular ligament compatible with prior tear but without total
rupture. Inferior tibiofibular ligaments intact. Mildly thickened
calcaneofibular ligament.

Medial: Accentuated activity in the deep tibiotalar portion of the
deltoid ligament. Spring ligament intact. Lisfranc ligament intact.

CARTILAGE

Ankle Joint: Small tibiotalar joint effusion, no focal osteochondral
lesion.

Subtalar Joints/Sinus Tarsi: Unremarkable

Bones: Low-level edema along the distal fibular tip.

Other: No supplemental non-categorized findings.
IMPRESSION: 1. High-grade partial tear of the anterior talofibular ligament
which remains thickened and edematous. There is also some mild
thickening of the calcaneofibular ligament indicating sprain.
2. Accentuated signal in the deep tibiotalar portion of the deltoid
ligament indicating sprain.
3. Extensor digitorum longus tenosynovitis. Mild common peroneus
tendon sheath tenosynovitis as well.
